# Patient Record
Sex: Female | Born: 2005 | Race: Black or African American | Hispanic: No | Marital: Single | Smoking: Current every day smoker
Health system: Southern US, Community
[De-identification: ages and names within clinical notes are randomized; demographics above are authoritative.]

## PROBLEM LIST (undated history)

## (undated) DIAGNOSIS — Z87898 Personal history of other specified conditions: Secondary | ICD-10-CM

## (undated) DIAGNOSIS — Z8739 Personal history of other diseases of the musculoskeletal system and connective tissue: Secondary | ICD-10-CM

## (undated) DIAGNOSIS — Z862 Personal history of diseases of the blood and blood-forming organs and certain disorders involving the immune mechanism: Secondary | ICD-10-CM

## (undated) HISTORY — DX: Personal history of other diseases of the musculoskeletal system and connective tissue: Z87.39

## (undated) HISTORY — DX: Personal history of diseases of the blood and blood-forming organs and certain disorders involving the immune mechanism: Z86.2

## (undated) HISTORY — PX: OTHER SURGICAL HISTORY: SHX169

## (undated) HISTORY — DX: Personal history of other specified conditions: Z87.898

---
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
## 2023-04-04 ENCOUNTER — Ambulatory Visit: Payer: Medicaid Other
